# Patient Record
Sex: Female | Born: 1950 | Race: Black or African American | Hispanic: No | Marital: Single | Smoking: Current some day smoker
Health system: Southern US, Community
[De-identification: ages and names within clinical notes are randomized; demographics above are authoritative.]

## PROBLEM LIST (undated history)

## (undated) DIAGNOSIS — I2510 Atherosclerotic heart disease of native coronary artery without angina pectoris: Secondary | ICD-10-CM

## (undated) DIAGNOSIS — I10 Essential (primary) hypertension: Secondary | ICD-10-CM

## (undated) HISTORY — PX: CARDIAC SURGERY: SHX584

---

## 2019-07-03 ENCOUNTER — Encounter (HOSPITAL_COMMUNITY): Payer: Self-pay

## 2019-07-03 ENCOUNTER — Emergency Department (HOSPITAL_COMMUNITY)
Admission: EM | Admit: 2019-07-03 | Discharge: 2019-07-03 | Disposition: A | Discharge status: Home / Self Care | Payer: Medicare Other | Attending: Emergency Medicine | Admitting: Emergency Medicine

## 2019-07-03 DIAGNOSIS — M542 Cervicalgia: Secondary | ICD-10-CM | POA: Diagnosis not present

## 2019-07-03 DIAGNOSIS — M545 Low back pain: Secondary | ICD-10-CM | POA: Insufficient documentation

## 2019-07-03 DIAGNOSIS — F17210 Nicotine dependence, cigarettes, uncomplicated: Secondary | ICD-10-CM | POA: Diagnosis not present

## 2019-07-03 DIAGNOSIS — I2510 Atherosclerotic heart disease of native coronary artery without angina pectoris: Secondary | ICD-10-CM | POA: Diagnosis not present

## 2019-07-03 DIAGNOSIS — I10 Essential (primary) hypertension: Secondary | ICD-10-CM | POA: Insufficient documentation

## 2019-07-03 DIAGNOSIS — R519 Headache, unspecified: Secondary | ICD-10-CM | POA: Diagnosis not present

## 2019-07-03 HISTORY — DX: Atherosclerotic heart disease of native coronary artery without angina pectoris: I25.10

## 2019-07-03 HISTORY — DX: Essential (primary) hypertension: I10

## 2019-07-03 NOTE — ED Notes (Signed)
ED Provider at bedside. 

## 2019-07-03 NOTE — ED Provider Notes (Signed)
[facility] DEPT Provider Note   CSN: [id number] Arrival date & time: 07/03/19  1247     History   Chief Complaint Chief Complaint  Patient presents with  . Hypertension  . Headache    HPI Caitlyn Chapman is a 68 y.o. female.     HPI  She is here for evaluation of blood pressure elevation which was 131/92 at home today.  This concerned her because typically her blood pressure is in the range of 120-140/72.  Since arrival here her headache is improved.  She has ongoing neck and lower back pain, being treated by pain management and is due to start physical therapy soon for her back.  She is due to have a cervical injection, next week on 07/09/2023 radiculopathy.  She denies fever, chills, cough, shortness of breath, weakness or dizziness.  There are no other known modifying factors.   Past Medical History:  Diagnosis Date  . Coronary artery disease   . Hypertension     There are no active problems to display for this patient.   Past Surgical History:  Procedure Laterality Date  . CARDIAC SURGERY       OB History   No obstetric history on file.      Home Medications    Prior to Admission medications   Not on File    Family History No family history on file.  Social History Social History   Tobacco Use  . Smoking status: Current Some Day Smoker    Packs/day: 0.50    Types: Cigarettes  . Smokeless tobacco: Never Used  Substance Use Topics  . Alcohol use: Not on file  . Drug use: Not on file     Allergies   Ace inhibitors and Penicillins   Review of Systems Review of Systems  All other systems reviewed and are negative.    Physical Exam Updated Vital Signs BP (!) 149/75 (BP Location: Right Arm)   Pulse 76   Temp 98.8 F (37.1 C) (Oral)   Resp 16   Ht 4\' 11"  (1.499 m)   Wt 59 kg   SpO2 97%   BMI 26.26 kg/m   Physical Exam Vitals signs and nursing note reviewed.  Constitutional:      Appearance: She is  well-developed.  HENT:     Head: Normocephalic and atraumatic.     Right Ear: External ear normal.     Left Ear: External ear normal.  Eyes:     Conjunctiva/sclera: Conjunctivae normal.     Pupils: Pupils are equal, round, and reactive to light.  Neck:     Musculoskeletal: Normal range of motion and neck supple.     Trachea: Phonation normal.  Cardiovascular:     Rate and Rhythm: Normal rate and regular rhythm.     Heart sounds: Normal heart sounds.  Pulmonary:     Effort: Pulmonary effort is normal.     Breath sounds: Normal breath sounds.  Musculoskeletal: Normal range of motion.     Comments: Mild tenderness of the paracervical and paralumbar musculature.  Skin:    General: Skin is warm and dry.  Neurological:     Mental Status: She is alert and oriented to person, place, and time.     Cranial Nerves: No cranial nerve deficit.     Sensory: No sensory deficit.     Motor: No abnormal muscle tone.     Coordination: Coordination normal.     Comments: No dysarthria or aphasia.  Psychiatric:  Mood and Affect: Mood normal.        Behavior: Behavior normal.        Thought Content: Thought content normal.        Judgment: Judgment normal.      ED Treatments / Results  Labs (all labs ordered are listed, but only abnormal results are displayed) Labs Reviewed - No data to display  EKG None  Radiology No results found.  Procedures Procedures (including critical care time)  Medications Ordered in ED Medications - No data to display   Initial Impression / Assessment and Plan / ED Course  I have reviewed the triage vital signs and the nursing notes.  Pertinent labs & imaging results that were available during my care of the patient were reviewed by me and considered in my medical decision making (see chart for details).         Patient Vitals for the past 24 hrs:  BP Temp Temp src Pulse Resp SpO2 Height Weight  07/03/19 1254 (!) 149/75 98.8 F (37.1 C)  Oral 76 16 97 % 4\' 11"  (1.499 m) 59 kg    1:48 PM Reevaluation with update and discussion. After initial assessment and treatment, an updated evaluation reveals no change in clinical status, findings discussed with the patient and all questions were answered.   Medical Decision Making: Patient concerned about blood pressure elevation because her diastolic pressure was high at home.  Diastolic pressure here is 75.  She has no other complaints and feels comfortable going home to monitor and continue current treatment.  She is following a low-salt diet and working on control of her pain.  Doubt hypertensive urgency, acute vascular process or metabolic instability.  CRITICAL CARE-no Performed by: Mancel Bale  Nursing Notes Reviewed/ Care Coordinated Applicable Imaging Reviewed Interpretation of Laboratory Data incorporated into ED treatment  The patient appears reasonably screened and/or stabilized for discharge and I doubt any other medical condition or other [facility] requiring further screening, evaluation, or treatment in the ED at this time prior to discharge.  Plan: Home Medications-usual; Home Treatments-low-salt diet, rest, heat to affected areas.; return here if the recommended treatment, does not improve the symptoms; Recommended follow up-PCP can.   Final Clinical Impressions(s) / ED Diagnoses   Final diagnoses:  Hypertension, unspecified type    ED Discharge Orders    None       [facility], MD 07/03/19 1350

## 2019-07-03 NOTE — Discharge Instructions (Signed)
Your blood pressure is only minimally elevated, at this time.  Continue taking your usual medicines and follow-up with your doctors as currently planned and, as needed.
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
## 2019-12-08 ENCOUNTER — Emergency Department (HOSPITAL_COMMUNITY)
Admission: EM | Admit: 2019-12-08 | Discharge: 2019-12-08 | Disposition: A | Discharge status: Home / Self Care | Payer: Medicare Other | Attending: Emergency Medicine | Admitting: Emergency Medicine

## 2019-12-08 ENCOUNTER — Other Ambulatory Visit: Payer: Self-pay

## 2019-12-08 ENCOUNTER — Encounter (HOSPITAL_COMMUNITY): Payer: Self-pay | Admitting: Emergency Medicine

## 2019-12-08 ENCOUNTER — Emergency Department (HOSPITAL_COMMUNITY): Payer: Medicare Other

## 2019-12-08 DIAGNOSIS — R112 Nausea with vomiting, unspecified: Secondary | ICD-10-CM | POA: Diagnosis not present

## 2019-12-08 DIAGNOSIS — Z79899 Other long term (current) drug therapy: Secondary | ICD-10-CM | POA: Diagnosis not present

## 2019-12-08 DIAGNOSIS — Z7982 Long term (current) use of aspirin: Secondary | ICD-10-CM | POA: Diagnosis not present

## 2019-12-08 DIAGNOSIS — I259 Chronic ischemic heart disease, unspecified: Secondary | ICD-10-CM | POA: Diagnosis not present

## 2019-12-08 DIAGNOSIS — I10 Essential (primary) hypertension: Secondary | ICD-10-CM | POA: Insufficient documentation

## 2019-12-08 DIAGNOSIS — R1110 Vomiting, unspecified: Secondary | ICD-10-CM | POA: Diagnosis present

## 2019-12-08 DIAGNOSIS — F17210 Nicotine dependence, cigarettes, uncomplicated: Secondary | ICD-10-CM | POA: Diagnosis not present

## 2019-12-08 LAB — URINALYSIS, ROUTINE W REFLEX MICROSCOPIC
Bilirubin Urine: NEGATIVE
Glucose, UA: NEGATIVE mg/dL
Hgb urine dipstick: NEGATIVE
Ketones, ur: NEGATIVE mg/dL
Nitrite: NEGATIVE
Protein, ur: 100 mg/dL — AB
Specific Gravity, Urine: 1.014 (ref 1.005–1.030)
WBC, UA: >50 WBC/hpf — ABNORMAL HIGH (ref 0–5)
pH: 5 (ref 5.0–8.0)

## 2019-12-08 LAB — CBC WITH DIFFERENTIAL/PLATELET
Abs Immature Granulocytes: 0.05 10*3/uL (ref 0.00–0.07)
Basophils Absolute: 0 10*3/uL (ref 0.0–0.1)
Basophils Relative: 0 %
Eosinophils Absolute: 0.2 10*3/uL (ref 0.0–0.5)
Eosinophils Relative: 2 %
HCT: 45.5 % (ref 36.0–46.0)
Hemoglobin: 13.4 g/dL (ref 12.0–15.0)
Immature Granulocytes: 0 %
Lymphocytes Relative: 18 %
Lymphs Abs: 2.2 10*3/uL (ref 0.7–4.0)
MCH: 31.1 pg (ref 26.0–34.0)
MCHC: 29.5 g/dL — ABNORMAL LOW (ref 30.0–36.0)
MCV: 105.6 fL — ABNORMAL HIGH (ref 80.0–100.0)
Monocytes Absolute: 0.5 10*3/uL (ref 0.1–1.0)
Monocytes Relative: 4 %
Neutro Abs: 8.9 10*3/uL — ABNORMAL HIGH (ref 1.7–7.7)
Neutrophils Relative %: 76 %
Platelets: 252 10*3/uL (ref 150–400)
RBC: 4.31 MIL/uL (ref 3.87–5.11)
RDW: 14.3 % (ref 11.5–15.5)
WBC: 11.8 10*3/uL — ABNORMAL HIGH (ref 4.0–10.5)
nRBC: 0 % (ref 0.0–0.2)

## 2019-12-08 LAB — I-STAT CHEM 8, ED
BUN: 17 mg/dL (ref 8–23)
Calcium, Ion: 1.27 mmol/L (ref 1.15–1.40)
Chloride: 102 mmol/L (ref 98–111)
Creatinine, Ser: 0.8 mg/dL (ref 0.44–1.00)
Glucose, Bld: 96 mg/dL (ref 70–99)
HCT: 43 % (ref 36.0–46.0)
Hemoglobin: 14.6 g/dL (ref 12.0–15.0)
Potassium: 4.2 mmol/L (ref 3.5–5.1)
Sodium: 140 mmol/L (ref 135–145)
TCO2: 26 mmol/L (ref 22–32)

## 2019-12-08 LAB — LIPASE, BLOOD: Lipase: 19 U/L (ref 11–51)

## 2019-12-08 MED ORDER — LIDOCAINE VISCOUS HCL 2 % MT SOLN
15.0000 mL | Freq: Once | OROMUCOSAL | Status: AC
  Administered 2019-12-08: 15 mL via ORAL
  Filled 2019-12-08: qty 15

## 2019-12-08 MED ORDER — PANTOPRAZOLE SODIUM 20 MG PO TBEC: 20.0000 mg | DELAYED_RELEASE_TABLET | Freq: Every day | ORAL | 0 refills | Status: AC

## 2019-12-08 MED ORDER — ONDANSETRON 4 MG PO TBDP: 4.0000 mg | ORAL_TABLET | Freq: Three times a day (TID) | ORAL | 0 refills | Status: AC | PRN

## 2019-12-08 MED ORDER — ALUM & MAG HYDROXIDE-SIMETH 200-200-20 MG/5ML PO SUSP
30.0000 mL | Freq: Once | ORAL | Status: AC
  Administered 2019-12-08: 30 mL via ORAL
  Filled 2019-12-08: qty 30

## 2019-12-08 NOTE — Discharge Instructions (Signed)
As discussed, all of your labs were reassuring today here in the ED.  I am sending you home with nausea medication and I have refilled your reflux medication.  I have included the number for the GI office. Please call tomorrow to schedule an appointment for further evaluation. Return to the ER for new or worsening symptoms.

## 2019-12-08 NOTE — ED Provider Notes (Signed)
[facility]-EMERGENCY DEPT Provider Note   CSN: [id number] Arrival date & time: 12/08/19  1331     History Chief Complaint  Patient presents with  . food getting stuck  . Emesis    Caitlyn Chapman is a 69 y.o. female with a past medical history significant for CAD, hypertension, and GERD who presents to the ED due to feelings that food is getting stuck in her "chest". Patient admits to non-bloody, non-bilious emesis roughly 30 minutes after any po intake for the past 2 days. Patient denies difficulties swallowing. Denies chest pain and shortness of breath. Caitlyn Chapman is typically on GERD medication, but ran out 1 week ago. Denies associated abdominal pain, diarrhea, fever, and chills. Never had this problem in the past before. No alleviating or aggravating factors. Patient notes Caitlyn Chapman is worried because Caitlyn Chapman is diabetic and hasn't been able to eat or drink anything in 2 days.   History obtained from patient and past medical records. No interpreter used during encounter.     Past Medical History:  Diagnosis Date  . Coronary artery disease   . Hypertension     There are no problems to display for this patient.   Past Surgical History:  Procedure Laterality Date  . CARDIAC SURGERY       OB History   No obstetric history on file.     No family history on file.  Social History   Tobacco Use  . Smoking status: Current Some Day Smoker    Packs/day: 0.50    Types: Cigarettes  . Smokeless tobacco: Never Used  Substance Use Topics  . Alcohol use: Not on file  . Drug use: Not on file    Home Medications Prior to Admission medications   Medication Sig Start Date End Date Taking? Authorizing Provider  albuterol (VENTOLIN HFA) 108 (90 Base) MCG/ACT inhaler Inhale 2 puffs into the lungs every 6 (six) hours as needed for wheezing or shortness of breath.  11/19/19  Yes [provider]  amLODipine (NORVASC) 10 MG tablet Take 10 mg by mouth daily. 10/24/19  Yes  [provider]  aspirin EC 81 MG tablet Take 81 mg by mouth daily.   Yes [provider]  atorvastatin (LIPITOR) 80 MG tablet Take 80 mg by mouth at bedtime. 07/22/19  Yes [provider]  carvedilol (COREG) 6.25 MG tablet Take 6.25 mg by mouth 2 (two) times daily. 10/24/19  Yes [provider]  metFORMIN (GLUCOPHAGE) 1000 MG tablet Take 1,000 mg by mouth 2 (two) times daily with a meal.  09/10/19  Yes [provider]  Multiple Vitamin (MULTIVITAMIN ADULT PO) Take 1 tablet by mouth daily.   Yes [provider]  pantoprazole (PROTONIX) 40 MG tablet Take 40 mg by mouth daily. 07/22/19  Yes [provider]  SPIRIVA HANDIHALER 18 MCG inhalation capsule Place 1 capsule into inhaler and inhale daily.  12/06/19  Yes [provider]  ondansetron (ZOFRAN ODT) 4 MG disintegrating tablet Take 1 tablet (4 mg total) by mouth every 8 (eight) hours as needed for nausea or vomiting. 12/08/19   Mannie Stabile, PA-C  pantoprazole (PROTONIX) 20 MG tablet Take 1 tablet (20 mg total) by mouth daily. 12/08/19   Mannie Stabile, PA-C    Allergies    Ace inhibitors and Penicillins  Review of Systems   Review of Systems  Constitutional: Negative for chills and fever.  Respiratory: Negative for shortness of breath.   Cardiovascular: Negative for chest pain.  Gastrointestinal: Positive for nausea and vomiting. Negative for abdominal pain and diarrhea.  Genitourinary: Negative for dysuria.  Musculoskeletal: Negative for back pain.  Neurological: Negative for dizziness and headaches.  All other systems reviewed and are negative.   Physical Exam Updated Vital Signs BP 132/75 (BP Location: Left Arm)   Pulse 79   Temp 98.3 F (36.8 C)   Resp 18   SpO2 100%   Physical Exam Vitals and nursing note reviewed.  Constitutional:      General: Caitlyn Chapman is not in acute distress.    Appearance: Caitlyn Chapman is not ill-appearing.  HENT:     Head:  Normocephalic.  Eyes:     Pupils: Pupils are equal, round, and reactive to light.  Cardiovascular:     Rate and Rhythm: Normal rate and regular rhythm.     Pulses: Normal pulses.     Heart sounds: Normal heart sounds. No murmur. No friction rub. No gallop.   Pulmonary:     Effort: Pulmonary effort is normal.     Breath sounds: Normal breath sounds.     Comments: Speaking in full sentences. Abdominal:     General: Abdomen is flat. Bowel sounds are normal. There is no distension.     Palpations: Abdomen is soft.     Tenderness: There is no abdominal tenderness. There is no guarding or rebound.     Comments: Abdomen soft, nondistended, nontender to palpation in all quadrants without guarding or peritoneal signs. No rebound.   Musculoskeletal:     Cervical back: Neck supple.     Comments: Able to move all 4 extremities without difficulty.   Skin:    General: Skin is warm and dry.  Neurological:     General: No focal deficit present.     Mental Status: Caitlyn Chapman is alert.  Psychiatric:        Mood and Affect: Mood normal.        Behavior: Behavior normal.     ED Results / Procedures / Treatments   Labs (all labs ordered are listed, but only abnormal results are displayed) Labs Reviewed  CBC WITH DIFFERENTIAL/PLATELET - Abnormal; Notable for the following components:      Result Value   WBC 11.8 (*)    MCV 105.6 (*)    MCHC 29.5 (*)    Neutro Abs 8.9 (*)    All other components within normal limits  URINALYSIS, ROUTINE W REFLEX MICROSCOPIC - Abnormal; Notable for the following components:   APPearance HAZY (*)    Protein, ur 100 (*)    Leukocytes,Ua MODERATE (*)    WBC, UA >50 (*)    Bacteria, UA MANY (*)    All other components within normal limits  LIPASE, BLOOD  I-STAT CHEM 8, ED    EKG None  Radiology DG Chest 2 View  Result Date: 12/08/2019 CLINICAL DATA:  69 year old female with food getting stuck in chest. EXAM: CHEST - 2 VIEW COMPARISON:  None. FINDINGS: The  lungs are clear. There is no pleural effusion or pneumothorax. The cardiac silhouette is within limits. Coronary vascular stent noted. Atherosclerotic calcification of the aorta. No acute osseous pathology. No radiopaque foreign object identified. IMPRESSION: No active cardiopulmonary disease. Electronically Signed   By: Elgie Collard M.D.   On: 12/08/2019 17:34    Procedures Procedures (including critical care time)  Medications Ordered in ED Medications  alum & mag hydroxide-simeth (MAALOX/MYLANTA) 200-200-20 MG/5ML suspension 30 mL (30 mLs Oral Given 12/08/19 1709)    And  lidocaine (XYLOCAINE) 2 % viscous mouth solution 15 mL (15 mLs Oral Given 12/08/19 1708)    ED Course  I have reviewed the triage vital signs and the nursing notes.  Pertinent labs & imaging results that were available during my care of the patient were reviewed by me and considered in my medical decision making (see chart for details).  Clinical Course as of Dec 07 2044  Sun Dec 08, 2019  1752 Chalmers Guest): MODERATE [CA]  1752 Bacteria, UA(!): MANY [CA]  1752 Squamous Epithelial / LPF: 6-10 [CA]  1807 WBC(!): 11.8 [CA]    Clinical Course User Index [CA] Suzy Bouchard, PA-C   MDM Rules/Calculators/A&P                     69 year old female presents to the ED due to feelings that food/water have been getting stuck in her "chest" for the past 2 days associated with nausea and vomiting 30 minutes after any po intake.  Upon arrival, patient is afebrile, not tachycardic or hypoxic.  Patient in no acute distress and non-ill-appearing.  Physical exam reassuring.  Abdomen soft, nondistended, nontender.  Patient in speaking full sentences.  Airway patent. Will obtain routine labs and CXR. Will give GI cocktail to assess swallowing ability/symptomatic relief.   Chest x-ray personally reviewed which is negative for signs pneumonia, pneumothorax, or widened mediastinum.  No evidence of radiopaque foreign bodies.   UA significant for moderate leukocytes and many bacteria with 6-10 squamous cells.  Urine culture pending.  Patient denies urinary symptoms.  Suspect contamination.  We will hold antibiotics at this time. CBC significant for mild leukocytosis at 11.8, but otherwise reassuring. Lipase normal at 19. Doubt pancreatitis. Chem 8 unremarkable with normal renal function and no electrolyte derangements.  Patient able to tolerate po in the ED without difficulty. Will discharge patient with Zofran and GI follow-up. Advised patient to call GI office tomorrow for further evaluation. Patient's protonix refilled. Strict ED precautions discussed with patient. Patient states understanding and agrees to plan. Patient discharged home in no acute distress and stable vitals.   Discussed case with Dr. Darl Householder who evaluated patient at bedside and agrees with assessment and plan.  Final Clinical Impression(s) / ED Diagnoses Final diagnoses:  Non-intractable vomiting with nausea, unspecified vomiting type    Rx / DC Orders ED Discharge Orders         Ordered    ondansetron (ZOFRAN ODT) 4 MG disintegrating tablet  Every 8 hours PRN     12/08/19 2012    pantoprazole (PROTONIX) 20 MG tablet  Daily     12/08/19 2043           Karie Kirks 12/08/19 2047    Drenda Freeze, MD 12/09/19 [phone]

## 2019-12-08 NOTE — ED Triage Notes (Signed)
Pt reports for past 2 days feels like her food gets stuck and then she vomits. Denies any pains. Reports that takes meds for acid reflux but out for past week.

## 2019-12-08 NOTE — ED Notes (Signed)
Urine culture sent down to lab with urinalysis. 

## 2020-01-12 ENCOUNTER — Encounter (HOSPITAL_COMMUNITY): Payer: Self-pay | Admitting: Emergency Medicine

## 2020-01-12 ENCOUNTER — Other Ambulatory Visit: Payer: Self-pay

## 2020-01-12 ENCOUNTER — Emergency Department (HOSPITAL_COMMUNITY)
Admission: EM | Admit: 2020-01-12 | Discharge: 2020-01-12 | Disposition: A | Discharge status: Home / Self Care | Payer: Medicare Other | Attending: Emergency Medicine | Admitting: Emergency Medicine

## 2020-01-12 DIAGNOSIS — F17210 Nicotine dependence, cigarettes, uncomplicated: Secondary | ICD-10-CM | POA: Diagnosis not present

## 2020-01-12 DIAGNOSIS — I2510 Atherosclerotic heart disease of native coronary artery without angina pectoris: Secondary | ICD-10-CM | POA: Insufficient documentation

## 2020-01-12 DIAGNOSIS — M4316 Spondylolisthesis, lumbar region: Secondary | ICD-10-CM | POA: Diagnosis not present

## 2020-01-12 DIAGNOSIS — Z79899 Other long term (current) drug therapy: Secondary | ICD-10-CM | POA: Diagnosis not present

## 2020-01-12 DIAGNOSIS — I10 Essential (primary) hypertension: Secondary | ICD-10-CM | POA: Diagnosis not present

## 2020-01-12 DIAGNOSIS — M545 Low back pain, unspecified: Secondary | ICD-10-CM

## 2020-01-12 LAB — URINALYSIS, ROUTINE W REFLEX MICROSCOPIC
Bilirubin Urine: NEGATIVE
Glucose, UA: NEGATIVE mg/dL
Hgb urine dipstick: NEGATIVE
Ketones, ur: NEGATIVE mg/dL
Leukocytes,Ua: NEGATIVE
Nitrite: NEGATIVE
Protein, ur: NEGATIVE mg/dL
Specific Gravity, Urine: 1.008 (ref 1.005–1.030)
pH: 5 (ref 5.0–8.0)

## 2020-01-12 MED ORDER — LIDOCAINE 5 % EX PTCH
1.0000 | MEDICATED_PATCH | Freq: Once | CUTANEOUS | Status: DC
  Administered 2020-01-12: 1 via TRANSDERMAL
  Filled 2020-01-12: qty 1

## 2020-01-12 MED ORDER — METHYLPREDNISOLONE 4 MG PO TBPK
ORAL_TABLET | ORAL | 0 refills | Status: AC
Start: 2020-01-12 — End: ?

## 2020-01-12 MED ORDER — LIDOCAINE 5 % EX PTCH: 1.0000 | MEDICATED_PATCH | CUTANEOUS | 0 refills | Status: AC

## 2020-01-12 NOTE — ED Triage Notes (Signed)
Patient here from home reporting lower right sided back pain that started 3 days ago . Constant pain. Reports OTC meds with no relief. Ambulatory with walker.
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
## 2020-01-12 NOTE — ED Notes (Signed)
An After Visit Summary was printed and given to the patient. Discharge instructions given and no further questions at this time.  

## 2020-01-12 NOTE — Discharge Instructions (Signed)
Please pick up medications and take as prescribe Follow up with your PCP as scheduled for Tuesday Return to the ED for any worsening symptoms including worsening pain, radiation of pain down your legs, inability to walk, numbness in your groin area, holding onto urine, peeing or pooping on yourself, or any other new/concerning symptoms

## 2021-02-08 IMAGING — CR DG CHEST 2V
2 series · 2 of 2 positions shown · non-contrast
Comparison: None.

CLINICAL DATA: 68-year-old female with food getting stuck in chest.

EXAM:
CHEST - 2 VIEW

[w chest pa]
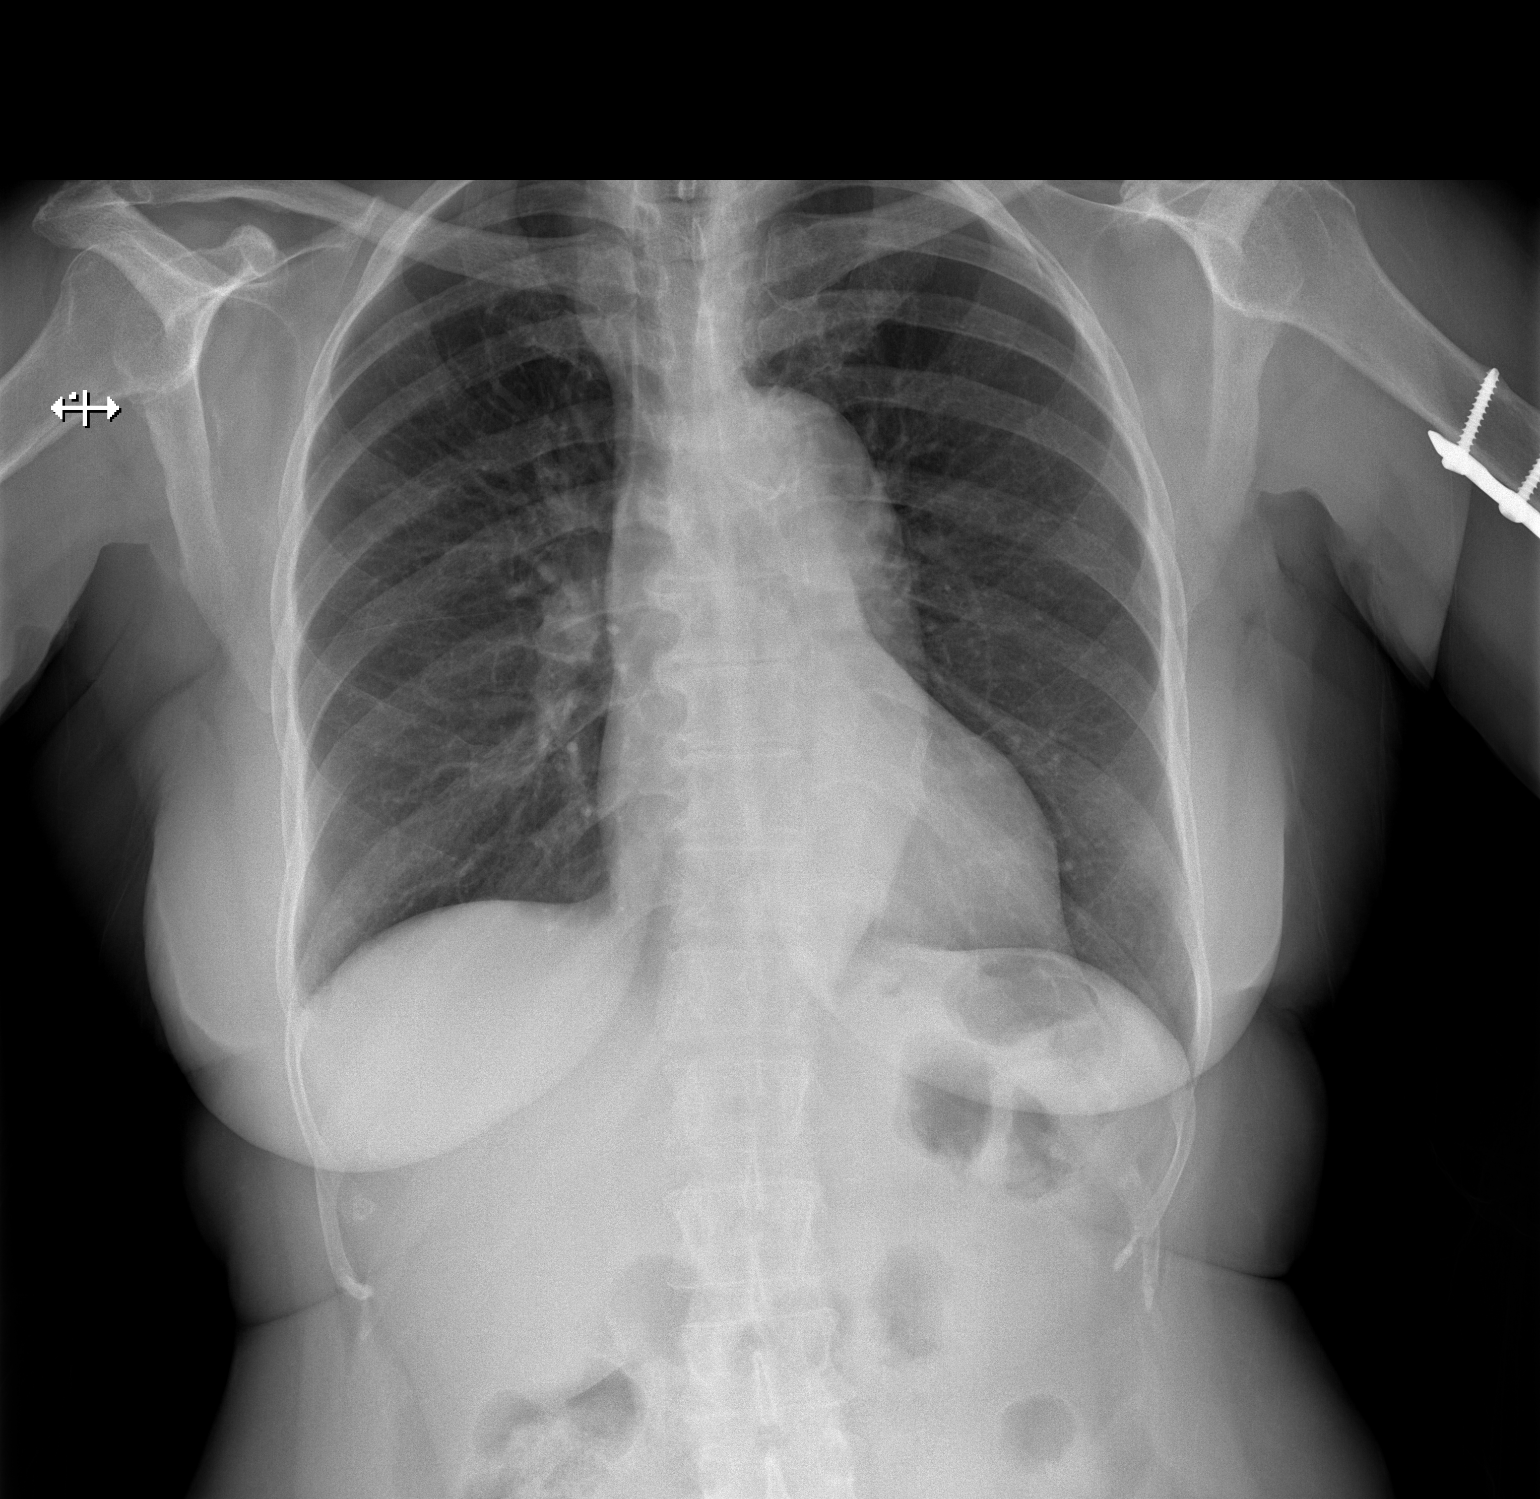

[w chest lat]
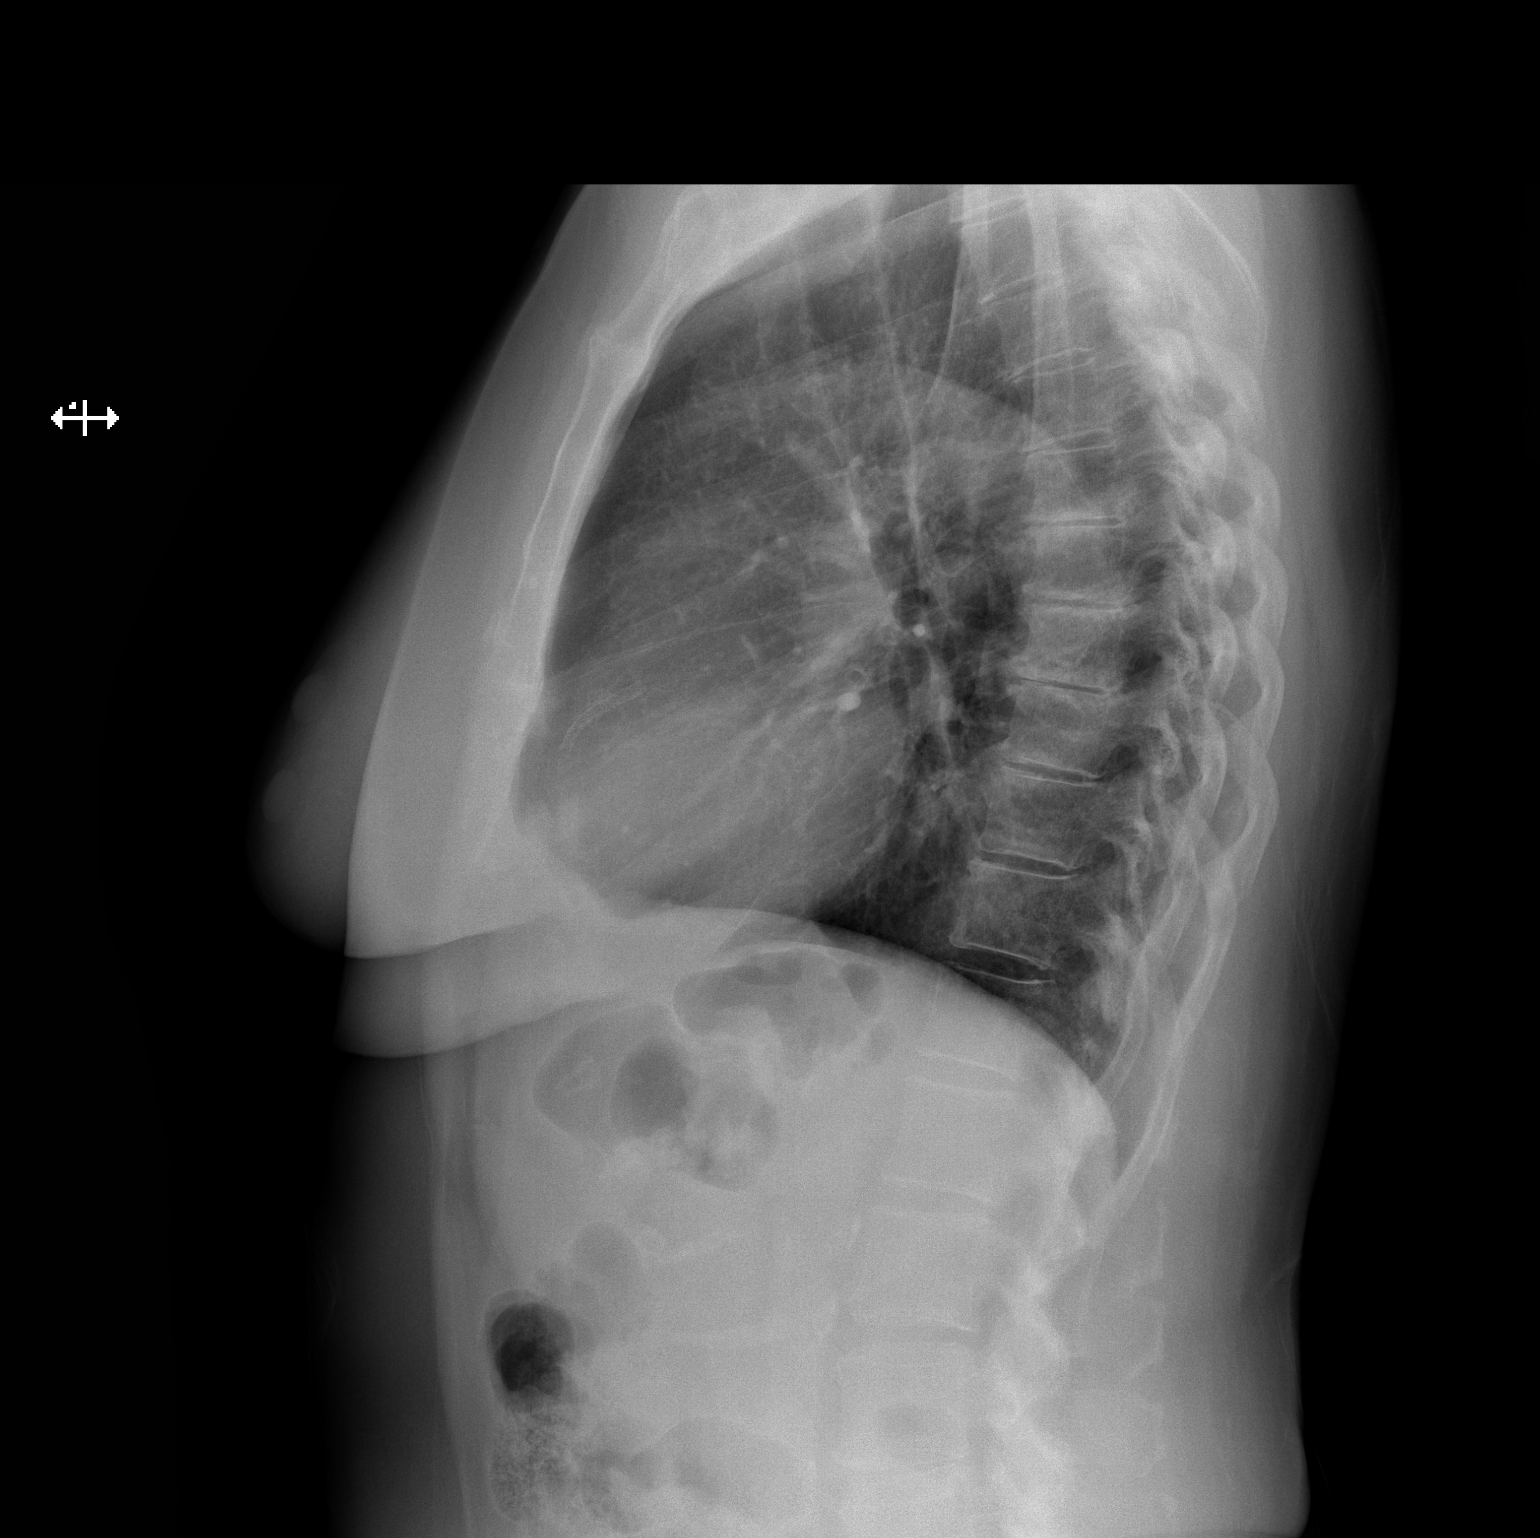

[2 of 2 positions shown; findings below may reference images not displayed]

FINDINGS: The lungs are clear. There is no pleural effusion or pneumothorax.
The cardiac silhouette is within limits. Coronary vascular stent
noted. Atherosclerotic calcification of the aorta. No acute osseous
pathology. No radiopaque foreign object identified.
IMPRESSION: No active cardiopulmonary disease.
